# Patient Record
Sex: Male | Born: 1960 | Race: White | Hispanic: No | Marital: Single | Smoking: Current every day smoker
Health system: Southern US, Community
[De-identification: ages and names within clinical notes are randomized; demographics above are authoritative.]

## PROBLEM LIST (undated history)

## (undated) DIAGNOSIS — K219 Gastro-esophageal reflux disease without esophagitis: Secondary | ICD-10-CM

## (undated) DIAGNOSIS — N189 Chronic kidney disease, unspecified: Secondary | ICD-10-CM

## (undated) DIAGNOSIS — F419 Anxiety disorder, unspecified: Secondary | ICD-10-CM

## (undated) DIAGNOSIS — IMO0001 Reserved for inherently not codable concepts without codable children: Secondary | ICD-10-CM

## (undated) HISTORY — PX: HERNIA REPAIR: SHX51

---

## 2009-06-06 ENCOUNTER — Emergency Department (HOSPITAL_COMMUNITY): Admission: EM | Admit: 2009-06-06 | Discharge: 2009-06-07 | Payer: Self-pay | Admitting: Emergency Medicine

## 2010-04-03 LAB — DIFFERENTIAL
Basophils Relative: 0 % (ref 0–1)
Monocytes Absolute: 0.5 10*3/uL (ref 0.1–1.0)

## 2010-04-03 LAB — CBC
HCT: 43 % (ref 39.0–52.0)
MCHC: 34.8 g/dL (ref 30.0–36.0)
WBC: 8 10*3/uL (ref 4.0–10.5)

## 2010-04-03 LAB — POCT I-STAT, CHEM 8
BUN: 13 mg/dL (ref 6–23)
Calcium, Ion: 1.11 mmol/L — ABNORMAL LOW (ref 1.12–1.32)
Chloride: 111 meq/L (ref 96–112)
Creatinine, Ser: 1.4 mg/dL (ref 0.4–1.5)
Glucose, Bld: 97 mg/dL (ref 70–99)
HCT: 46 % (ref 39.0–52.0)
Hemoglobin: 15.6 g/dL (ref 13.0–17.0)
Potassium: 4 meq/L (ref 3.5–5.1)
Sodium: 143 meq/L (ref 135–145)
TCO2: 23 mmol/L (ref 0–100)
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
## 2013-08-25 ENCOUNTER — Encounter: Payer: Self-pay | Admitting: Gastroenterology

## 2013-10-27 ENCOUNTER — Ambulatory Visit: Payer: Self-pay | Admitting: Gastroenterology

## 2013-11-09 ENCOUNTER — Other Ambulatory Visit: Payer: Self-pay | Admitting: Urology

## 2013-11-26 ENCOUNTER — Encounter (HOSPITAL_COMMUNITY): Payer: Self-pay | Admitting: *Deleted

## 2013-11-30 ENCOUNTER — Ambulatory Visit (HOSPITAL_COMMUNITY): Payer: BC Managed Care – PPO

## 2013-11-30 ENCOUNTER — Ambulatory Visit (HOSPITAL_COMMUNITY)
Admission: RE | Admit: 2013-11-30 | Discharge: 2013-11-30 | Disposition: A | Discharge status: Home / Self Care | Payer: BC Managed Care – PPO | Source: Ambulatory Visit | Attending: Urology | Admitting: Urology

## 2013-11-30 ENCOUNTER — Encounter (HOSPITAL_COMMUNITY)
Admission: RE | Disposition: A | Discharge status: Home / Self Care | Payer: Self-pay | Source: Ambulatory Visit | Attending: Urology

## 2013-11-30 ENCOUNTER — Encounter (HOSPITAL_COMMUNITY): Payer: Self-pay | Admitting: *Deleted

## 2013-11-30 DIAGNOSIS — N202 Calculus of kidney with calculus of ureter: Secondary | ICD-10-CM | POA: Insufficient documentation

## 2013-11-30 DIAGNOSIS — F17210 Nicotine dependence, cigarettes, uncomplicated: Secondary | ICD-10-CM | POA: Diagnosis not present

## 2013-11-30 DIAGNOSIS — F419 Anxiety disorder, unspecified: Secondary | ICD-10-CM | POA: Insufficient documentation

## 2013-11-30 DIAGNOSIS — N200 Calculus of kidney: Secondary | ICD-10-CM

## 2013-11-30 DIAGNOSIS — K219 Gastro-esophageal reflux disease without esophagitis: Secondary | ICD-10-CM | POA: Diagnosis not present

## 2013-11-30 HISTORY — DX: Gastro-esophageal reflux disease without esophagitis: K21.9

## 2013-11-30 HISTORY — DX: Anxiety disorder, unspecified: F41.9

## 2013-11-30 HISTORY — DX: Reserved for inherently not codable concepts without codable children: IMO0001

## 2013-11-30 HISTORY — DX: Chronic kidney disease, unspecified: N18.9

## 2013-11-30 SURGERY — LITHOTRIPSY, ESWL
Anesthesia: LOCAL | Laterality: Right

## 2013-11-30 MED ORDER — DIPHENHYDRAMINE HCL 25 MG PO CAPS
25.0000 mg | ORAL_CAPSULE | ORAL | Status: AC
  Administered 2013-11-30: 25 mg via ORAL
  Filled 2013-11-30: qty 1

## 2013-11-30 MED ORDER — DEXTROSE-NACL 5-0.45 % IV SOLN
INTRAVENOUS | Status: DC
  Administered 2013-11-30: 09:00:00 via INTRAVENOUS

## 2013-11-30 MED ORDER — DIAZEPAM 5 MG PO TABS
10.0000 mg | ORAL_TABLET | ORAL | Status: AC
  Administered 2013-11-30: 10 mg via ORAL
  Filled 2013-11-30: qty 2

## 2013-11-30 MED ORDER — CIPROFLOXACIN HCL 500 MG PO TABS
500.0000 mg | ORAL_TABLET | ORAL | Status: AC
  Administered 2013-11-30: 500 mg via ORAL
  Filled 2013-11-30: qty 1

## 2013-11-30 NOTE — Progress Notes (Signed)
Dr. Patsi Searsannenbaum paged to give report tom family

## 2013-11-30 NOTE — H&P (Signed)
Reason For Visit F/u after CT   Active Problems Problems  1. Calculus of distal right ureter (N20.1)   Assessed By: Jethro Bolusannenbaum, Parley Pidcock (Urology); Last Assessed: 25 Sep 2013 2. Right nephrolithiasis (N20.0)   Assessed By: Jethro Bolusannenbaum, Niana Martorana (Urology); Last Assessed: 06 Nov 2013  History of Present Illness     53 yo single male returns today after CT scan. Originally referred by Dr. Manson PasseyBrown for further evaluation of RLQ pain & dysuria. 08/24/13 CT showed that he has a 3mm Rt UVJ stone. He thinks that he passed the stone, but is unsure. Took Flomax, but ran out of medicine. He has had the med refilled and is taking th e med now.  He is having urgency, frequency, dysuria, incontinence, intermittent, weak flow, hesitancy, and straining to void.    Family hx significant for father had prostate cancer.   Pt is voiding better on Flomax, but c/o " runny" ejaculate.   Past Medical History Problems  1. History of Anxiety (F41.9) 2. History of esophageal reflux (Z87.19)  Surgical History Problems  1. History of Inguinal Hernia Repair  Current Meds 1. Ampicillin 500 MG Oral Capsule; TAKE 1 CAPSULE 4 TIMES DAILY;  Therapy: 11Sep2015 to (Evaluate:16Sep2015)  Requested for:  11Sep2015; Last Rx:11Sep2015 Ordered 2. Omeprazole CPDR;  Therapy: (Recorded:11Sep2015) to Recorded 3. Tamsulosin HCl - 0.4 MG Oral Capsule;  Therapy: (Recorded:11Sep2015) to Recorded  Allergies Medication  1. No Known Drug Allergies  Family History Problems  1. Family history of Deceased : Father 2. Family history of prostate cancer (Z80.42) : Father  Social History Problems  1. Alcohol use (F10.99)   2 per day 2. Caffeine use (F15.90)   4-5 per day 3. Current every day smoker (F17.200) 4. Number of children   1 son, 1 daughter 5. Occupation   [employer], machine shop, Personnel officerlectrician 6. Single 7. Smokes cigarettes (F17.210)   1/2 ppd x 35 yrs  Review of Systems Genitourinary, constitutional,  skin, eye, otolaryngeal, hematologic/lymphatic, cardiovascular, pulmonary, endocrine, musculoskeletal, gastrointestinal, neurological and psychiatric system(s) were reviewed and pertinent findings if present are noted.  Genitourinary: urinary frequency, feelings of urinary urgency, dysuria, nocturia, incontinence, difficulty starting the urinary stream, weak urinary stream, urinary stream starts and stops, incomplete emptying of bladder, hematuria and initiating urination requires straining.  Gastrointestinal: nausea, vomiting, heartburn and diarrhea.  Constitutional: feeling tired (fatigue).  Respiratory: shortness of breath.  Psychiatric: anxiety.    Vitals Vital Signs [Data Includes: Last 1 Day]  Recorded: 23Oct2015 02:31PM  Blood Pressure: 123 / 80 Temperature: 98.3 F Heart Rate: 85  Physical Exam Constitutional: Well nourished and well developed . No acute distress.  ENT:. The ears and nose are normal in appearance.  Neck: The appearance of the neck is normal and no neck mass is present.  Pulmonary: No respiratory distress and normal respiratory rhythm and effort.  Cardiovascular: Heart rate and rhythm are normal . No peripheral edema.  Abdomen: The abdomen is soft and nontender. No masses are palpated. No CVA tenderness. No hernias are palpable. No hepatosplenomegaly noted.  Genitourinary: Examination of the penis demonstrates no discharge, no masses, no lesions and a normal meatus. The scrotum is without lesions. The right epididymis is palpably normal and non-tender. The left epididymis is palpably normal and non-tender. The right testis is non-tender and without masses. The left testis is non-tender and without masses.  Lymphatics: The femoral and inguinal nodes are not enlarged or tender.  Skin: Normal skin turgor, no  visible rash and no visible skin lesions.  Neuro/Psych:. Mood and affect are appropriate.    Results/Data Selected Results  AU CT-STONE PROTOCOL 02Oct2015 12:00AM  Jethro Bolusannenbaum, Sita Mangen   Test Name Result Flag Reference  CT-STONE PROTOCOL (Report)    ** RADIOLOGY REPORT BY [facility] **   CLINICAL DATA: 53 year old male with history of right-sided back pain. Known history of right distal ureteral stone.  EXAM: CT ABDOMEN AND PELVIS WITHOUT CONTRAST (URINARY CALCULUS PROTOCOL)  TECHNIQUE: Multidetector CT imaging was performed through the abdomen and pelvis without intravenous contrast to include the urinary tract.  COMPARISON: CT of the abdomen and pelvis 08/24/2013.  FINDINGS: Lower chest: Unremarkable.  Hepatobiliary: The low attenuation throughout the hepatic parenchyma noted on the prior study has resolved on today's examination. There continue to be several small low-attenuation lesions in the liver, largest of which is in the central aspect of segment 4A measuring 13 x 14 mm (image 11 of series 2). Gallbladder is unremarkable in appearance.  Pancreas: Unremarkable.  Spleen: Unremarkable.  Adrenals/Urinary Tract: The previously noted calculus at the right ureterovesicular junction is no longer evident, and has presumably passed. There continues to be a 7 mm nonobstructive calculus in the upper pole collecting system of the right kidney. 1 mm nonobstructive calculus in the interpolar collecting system of the left kidney is also unchanged. No ureteral stones or bladder calculi are noted at this time. No hydroureteronephrosis or perinephric stranding to indicate urinary tract obstruction at this time. The appearance of the adrenal glands is unremarkable.  Stomach/Bowel: Stomach is grossly normal in appearance. No pathologic dilatation of the small bowel or colon. No inflammatory changes associated with the small bowel or colon.  Vascular/Lymphatic: Minimal atherosclerotic calcifications in the abdominal vasculature, without evidence of aneurysm. No definite lymphadenopathy identified in the abdomen or pelvis on  today's non contrast CT examination.  Reproductive: Prostate gland is unremarkable in appearance.  Other: No significant volume of ascites. No pneumoperitoneum.  Musculoskeletal: There are no aggressive appearing lytic or blastic lesions noted in the visualized portions of the skeleton.  IMPRESSION: 1. Previously noted 3 mm calculus at the right ureterovesicular junction is no longer identified in the bladder, and has presumably passed. 2. Nonobstructive calculi in the collecting systems of the kidneys bilaterally are again noted, largest of which measures 7 mm in the upper pole of the right kidney. 3. Previously noted hepatic steatosis has resolved. However, there are several small low-attenuation liver lesions which are incompletely characterized on today's non contrast CT examination. These could be definitively characterized with MRI of the abdomen with and without IV gadolinium if of clinical concern, however, these are statistically likely small cysts. 4. Additional incidental findings, as above.   Electronically Signed  By: Trudie Reedaniel Entrikin M.D.  On: 10/16/2013 09:58   Assessment Assessed  1. Right nephrolithiasis (N20.0)  1. Pt has 6.166mm R upper pole stone. Will need lithotripsy. Appears to have 1mm Ca++ in L kidney, but this is not of consequence. He has passed the stone from the Right lower ureter.    Flomax is causing some retrograde ejaculation.   Plan Right nephrolithiasis  1. Changed: From  Tamsulosin HCl - 0.4 MG Oral Capsule  To Tamsulosin  HCl - 0.4 MG Oral Capsule TAKE 1 CAPSULE Bedtime 2. Follow-up Schedule Surgery Office  Follow-up  Status: Hold For -  Appointment  Requested for: 23Oct2015  1. Continue Flomax  2. Schedulel lithotripsy of 6.16mm R upper pole stone.   Discussion/Summary cc: Dr.  [employer] @ [facility]     Signatures Electronically signed by : Jethro Bolus, M.D.; Nov 06 2013  2:54PM EST

## 2013-11-30 NOTE — Interval H&P Note (Signed)
History and Physical Interval Note:  11/30/2013 10:30 AM  Johnathan Vargas  has presented today for surgery, with the diagnosis of RIGHT URETERAL STONE  The various methods of treatment have been discussed with the patient and family. After consideration of risks, benefits and other options for treatment, the patient has consented to  Procedure(s): RIGHT EXTRACORPOREAL SHOCK WAVE LITHOTRIPSY (ESWL) (Right) as a surgical intervention .  The patient's history has been reviewed, patient examined, no change in status, stable for surgery.  I have reviewed the patient's chart and labs.  Questions were answered to the patient's satisfaction.     Jethro BolusANNENBAUM, Ahmari Duerson I

## 2013-11-30 NOTE — Discharge Instructions (Signed)
Kidney Stones °Kidney stones (urolithiasis) are solid masses that form inside your kidneys. The intense pain is caused by the stone moving through the kidney, ureter, bladder, and urethra (urinary tract). When the stone moves, the ureter starts to spasm around the stone. The stone is usually passed in your pee (urine).  °HOME CARE °· Drink enough fluids to keep your pee clear or pale yellow. This helps to get the stone out. °· Strain all pee through the provided strainer. Do not pee without peeing through the strainer, not even once. If you pee the stone out, catch it in the strainer. The stone may be as small as a grain of salt. Take this to your doctor. This will help your doctor figure out what you can do to try to prevent more kidney stones. °· Only take medicine as told by your doctor. °· Follow up with your doctor as told. °· Get follow-up X-rays as told by your doctor. °GET HELP IF: °You have pain that gets worse even if you have been taking pain medicine. °GET HELP RIGHT AWAY IF:  °· Your pain does not get better with medicine. °· You have a fever or shaking chills. °· Your pain increases and gets worse over 18 hours. °· You have new belly (abdominal) pain. °· You feel faint or pass out. °· You are unable to pee. °MAKE SURE YOU:  °· Understand these instructions. °· Will watch your condition. °· Will get help right away if you are not doing well or get worse. °Document Released: 06/20/2007 Document Revised: 09/03/2012 Document Reviewed: 06/04/2012 °ExitCare® Patient Information ©2015 [employer]. This information is not intended to replace advice given to you by your health care provider. Make sure you discuss any questions you have with your health care provider. ° °Dietary Guidelines to Help Prevent Kidney Stones °Your risk of kidney stones can be decreased by adjusting the foods you eat. The most important thing you can do is drink enough fluid. You should drink enough fluid to keep your urine clear or  pale yellow. The following guidelines provide specific information for the type of kidney stone you have had. °GUIDELINES ACCORDING TO TYPE OF KIDNEY STONE °Calcium Oxalate Kidney Stones °· Reduce the amount of salt you eat. Foods that have a lot of salt cause your body to release excess calcium into your urine. The excess calcium can combine with a substance called oxalate to form kidney stones. °· Reduce the amount of animal protein you eat if the amount you eat is excessive. Animal protein causes your body to release excess calcium into your urine. Ask your dietitian how much protein from animal sources you should be eating. °· Avoid foods that are high in oxalates. If you take vitamins, they should have less than 500 mg of vitamin C. Your body turns vitamin C into oxalates. You do not need to avoid fruits and vegetables high in vitamin C. °Calcium Phosphate Kidney Stones °· Reduce the amount of salt you eat to help prevent the release of excess calcium into your urine. °· Reduce the amount of animal protein you eat if the amount you eat is excessive. Animal protein causes your body to release excess calcium into your urine. Ask your dietitian how much protein from animal sources you should be eating. °· Get enough calcium from food or take a calcium supplement (ask your dietitian for recommendations). Food sources of calcium that do not increase your risk of kidney stones include: °¨ Broccoli. °¨ Dairy products,   such as cheese and yogurt. °¨ Pudding. °Uric Acid Kidney Stones °· Do not have more than 6 oz of animal protein per day. °FOOD SOURCES °Animal Protein Sources °· Meat (all types). °· Poultry. °· Eggs. °· Fish, seafood. °Foods High in Salt °· Salt seasonings. °· Soy sauce. °· Teriyaki sauce. °· Cured and processed meats. °· Salted crackers and snack foods. °· Fast food. °· Canned soups and most canned foods. °Foods High in Oxalates °· Grains: °¨ Amaranth. °¨ Barley. °¨ Grits. °¨ Wheat  germ. °¨ Bran. °¨ Buckwheat flour. °¨ All bran cereals. °¨ Pretzels. °¨ Whole wheat bread. °· Vegetables: °¨ Beans (wax). °¨ Beets and beet greens. °¨ Collard greens. °¨ Eggplant. °¨ Escarole. °¨ Leeks. °¨ Okra. °¨ Parsley. °¨ Rutabagas. °¨ Spinach. °¨ Swiss chard. °¨ Tomato paste. °¨ Fried potatoes. °¨ Sweet potatoes. °· Fruits: °¨ Red currants. °¨ Figs. °¨ Kiwi. °¨ Rhubarb. °· Meat and Other Protein Sources: °¨ Beans (dried). °¨ Soy burgers and other soybean products. °¨ Miso. °¨ Nuts (peanuts, almonds, pecans, cashews, hazelnuts). °¨ Nut butters. °¨ Sesame seeds and tahini (paste made of sesame seeds). °¨ Poppy seeds. °· Beverages: °¨ Chocolate drink mixes. °¨ Soy milk. °¨ Instant iced tea. °¨ Juices made from high-oxalate fruits or vegetables. °· Other: °¨ Carob. °¨ Chocolate. °¨ Fruitcake. °¨ Marmalades. °Document Released: 04/28/2010 Document Revised: 01/06/2013 Document Reviewed: 11/28/2012 °ExitCare® Patient Information ©2015 [employer]. This information is not intended to replace advice given to you by your health care provider. Make sure you discuss any questions you have with your health care provider. ° °Lithotripsy for Kidney Stones °Lithotripsy is a treatment that can sometimes help eliminate kidney stones and pain that they cause. A form of lithotripsy, also known as extracorporeal shock wave lithotripsy, is a nonsurgical procedure that helps your body rid itself of the kidney stone when it is too big to pass on its own. Extracorporeal shock wave lithotripsy is a method of crushing a kidney stone with shock waves. These shock waves pass through your body and are focused on your stone. They cause the kidney stones to crumble while still in the urinary tract. It is then easier for the smaller pieces of stone to pass in the urine. °Lithotripsy usually takes about an hour. It is done in a hospital, a lithotripsy center, or a mobile unit. It usually does not require an overnight stay. Your health care  provider will instruct you on preparation for the procedure. Your health care provider will tell you what to expect afterward. °LET YOUR HEALTH CARE PROVIDER KNOW ABOUT: °· Any allergies you have. °· All medicines you are taking, including vitamins, herbs, eye drops, creams, and over-the-counter medicines. °· Previous problems you or members of your family have had with the use of anesthetics. °· Any blood disorders you have. °· Previous surgeries you have had. °· Medical conditions you have. °RISKS AND COMPLICATIONS °Generally, lithotripsy for kidney stones is a safe procedure. However, as with any procedure, complications can occur. Possible complications include: °· Infection. °· Bleeding of the kidney. °· Bruising of the kidney or skin. °· Obstruction of the ureter. °· Failure of the stone to fragment. °BEFORE THE PROCEDURE °· Do not eat or drink for 6-8 hours prior to the procedure. You may, however, take the medications with a sip of water that your physician instructs you to take °· Do not take aspirin or aspirin-containing products for 7 days prior to your procedure °· Do not take nonsteroidal anti-inflammatory products for   7 days prior to your procedure °PROCEDURE °A stent (flexible tube with holes) may be placed in your ureter. The ureter is the tube that transports the urine from the kidneys to the bladder. Your health care provider may place a stent before the procedure. This will help keep urine flowing from the kidney if the fragments of the stone block the ureter. You may have an IV tube placed in one of your veins to give you fluids and medicines. These medicines may help you relax or make you sleep. During the procedure, you will lie comfortably on a fluid-filled cushion or in a warm-water bath. After an X-ray or ultrasound exam to locate your stone, shock waves are aimed at the stone. If you are awake, you may feel a tapping sensation as the shock waves pass through your body. If large stone  particles remain after treatment, a second procedure may be necessary at a later date. °For comfort during the test: °· Relax as much as possible. °· Try to remain still as much as possible. °· Try to follow instructions to speed up the test. °· Let your health care provider know if you are uncomfortable, anxious, or in pain. °AFTER THE PROCEDURE  °After surgery, you will be taken to the recovery area. A nurse will watch and check your progress. Once you're awake, stable, and taking fluids well, you will be allowed to go home as long as there are no problems. You will also be allowed to pass your urine before discharge. You may be given antibiotics to help prevent infection. You may also be prescribed pain medicine if needed. In a week or two, your health care provider may remove your stent, if you have one. You may first have an X-ray exam to check on how successful the fragmentation of your stone has been and how much of the stone has passed. Your health care provider will check to see whether or not stone particles remain. °SEEK IMMEDIATE MEDICAL CARE IF: °· You develop a fever or shaking chills. °· Your pain is not relieved by medicine. °· You feel sick to your stomach (nauseated) and you vomit. °· You develop heavy bleeding. °· You have difficulty urinating. °· You start to pass your stent from your penis. °Document Released: 12/30/1999 Document Revised: 10/22/2012 Document Reviewed: 07/17/2012 °ExitCare® Patient Information ©2015 [employer]. This information is not intended to replace advice given to you by your health care provider. Make sure you discuss any questions you have with your health care provider. ° °

## 2013-12-23 ENCOUNTER — Ambulatory Visit: Payer: Self-pay | Admitting: Gastroenterology

## 2016-02-27 DIAGNOSIS — R5383 Other fatigue: Secondary | ICD-10-CM | POA: Diagnosis not present

## 2016-02-27 DIAGNOSIS — R6882 Decreased libido: Secondary | ICD-10-CM | POA: Diagnosis not present

## 2016-02-27 DIAGNOSIS — R072 Precordial pain: Secondary | ICD-10-CM | POA: Diagnosis not present

## 2016-02-27 DIAGNOSIS — K210 Gastro-esophageal reflux disease with esophagitis: Secondary | ICD-10-CM | POA: Diagnosis not present

## 2016-06-29 DIAGNOSIS — K210 Gastro-esophageal reflux disease with esophagitis: Secondary | ICD-10-CM | POA: Diagnosis not present

## 2016-06-29 DIAGNOSIS — R030 Elevated blood-pressure reading, without diagnosis of hypertension: Secondary | ICD-10-CM | POA: Diagnosis not present

## 2016-10-25 DIAGNOSIS — M791 Myalgia, unspecified site: Secondary | ICD-10-CM | POA: Diagnosis not present

## 2016-10-25 DIAGNOSIS — F411 Generalized anxiety disorder: Secondary | ICD-10-CM | POA: Diagnosis not present

## 2016-10-25 DIAGNOSIS — M25511 Pain in right shoulder: Secondary | ICD-10-CM | POA: Diagnosis not present

## 2016-10-25 DIAGNOSIS — M545 Low back pain: Secondary | ICD-10-CM | POA: Diagnosis not present

## 2016-11-11 DIAGNOSIS — L989 Disorder of the skin and subcutaneous tissue, unspecified: Secondary | ICD-10-CM | POA: Diagnosis not present

## 2016-11-11 DIAGNOSIS — T63301A Toxic effect of unspecified spider venom, accidental (unintentional), initial encounter: Secondary | ICD-10-CM | POA: Diagnosis not present

## 2016-12-15 DIAGNOSIS — Z23 Encounter for immunization: Secondary | ICD-10-CM | POA: Diagnosis not present

## 2016-12-28 DIAGNOSIS — Z131 Encounter for screening for diabetes mellitus: Secondary | ICD-10-CM | POA: Diagnosis not present

## 2016-12-28 DIAGNOSIS — Z125 Encounter for screening for malignant neoplasm of prostate: Secondary | ICD-10-CM | POA: Diagnosis not present

## 2016-12-28 DIAGNOSIS — Z0000 Encounter for general adult medical examination without abnormal findings: Secondary | ICD-10-CM | POA: Diagnosis not present

## 2017-01-04 DIAGNOSIS — R1310 Dysphagia, unspecified: Secondary | ICD-10-CM | POA: Diagnosis not present

## 2017-01-04 DIAGNOSIS — K210 Gastro-esophageal reflux disease with esophagitis: Secondary | ICD-10-CM | POA: Diagnosis not present

## 2017-01-04 DIAGNOSIS — Z0000 Encounter for general adult medical examination without abnormal findings: Secondary | ICD-10-CM | POA: Diagnosis not present

## 2017-01-04 DIAGNOSIS — R002 Palpitations: Secondary | ICD-10-CM | POA: Diagnosis not present

## 2017-01-23 ENCOUNTER — Other Ambulatory Visit: Payer: Self-pay | Admitting: Gastroenterology

## 2017-01-23 DIAGNOSIS — R1013 Epigastric pain: Secondary | ICD-10-CM

## 2017-01-23 DIAGNOSIS — R1310 Dysphagia, unspecified: Secondary | ICD-10-CM | POA: Diagnosis not present

## 2017-01-23 DIAGNOSIS — Z1211 Encounter for screening for malignant neoplasm of colon: Secondary | ICD-10-CM | POA: Diagnosis not present

## 2017-02-01 ENCOUNTER — Ambulatory Visit
Admission: RE | Admit: 2017-02-01 | Discharge: 2017-02-01 | Disposition: A | Discharge status: Home / Self Care | Payer: BLUE CROSS/BLUE SHIELD | Source: Ambulatory Visit | Attending: Gastroenterology | Admitting: Gastroenterology

## 2017-02-01 DIAGNOSIS — R1013 Epigastric pain: Secondary | ICD-10-CM

## 2017-02-01 DIAGNOSIS — K7689 Other specified diseases of liver: Secondary | ICD-10-CM | POA: Diagnosis not present

## 2017-02-22 DIAGNOSIS — K635 Polyp of colon: Secondary | ICD-10-CM | POA: Diagnosis not present

## 2017-02-22 DIAGNOSIS — R1013 Epigastric pain: Secondary | ICD-10-CM | POA: Diagnosis not present

## 2017-02-22 DIAGNOSIS — K228 Other specified diseases of esophagus: Secondary | ICD-10-CM | POA: Diagnosis not present

## 2017-02-22 DIAGNOSIS — Z1211 Encounter for screening for malignant neoplasm of colon: Secondary | ICD-10-CM | POA: Diagnosis not present

## 2017-02-22 DIAGNOSIS — D122 Benign neoplasm of ascending colon: Secondary | ICD-10-CM | POA: Diagnosis not present

## 2017-02-22 DIAGNOSIS — K209 Esophagitis, unspecified: Secondary | ICD-10-CM | POA: Diagnosis not present

## 2017-02-22 DIAGNOSIS — D127 Benign neoplasm of rectosigmoid junction: Secondary | ICD-10-CM | POA: Diagnosis not present

## 2017-02-22 DIAGNOSIS — R1310 Dysphagia, unspecified: Secondary | ICD-10-CM | POA: Diagnosis not present

## 2017-07-05 DIAGNOSIS — E782 Mixed hyperlipidemia: Secondary | ICD-10-CM | POA: Diagnosis not present

## 2017-07-12 DIAGNOSIS — E782 Mixed hyperlipidemia: Secondary | ICD-10-CM | POA: Diagnosis not present

## 2017-07-12 DIAGNOSIS — R030 Elevated blood-pressure reading, without diagnosis of hypertension: Secondary | ICD-10-CM | POA: Diagnosis not present

## 2017-07-12 DIAGNOSIS — K210 Gastro-esophageal reflux disease with esophagitis: Secondary | ICD-10-CM | POA: Diagnosis not present

## 2017-08-06 DIAGNOSIS — H9319 Tinnitus, unspecified ear: Secondary | ICD-10-CM | POA: Diagnosis not present

## 2017-08-06 DIAGNOSIS — E782 Mixed hyperlipidemia: Secondary | ICD-10-CM | POA: Diagnosis not present

## 2017-08-22 DIAGNOSIS — N390 Urinary tract infection, site not specified: Secondary | ICD-10-CM | POA: Diagnosis not present

## 2017-08-22 DIAGNOSIS — R1031 Right lower quadrant pain: Secondary | ICD-10-CM | POA: Diagnosis not present

## 2017-08-23 DIAGNOSIS — N200 Calculus of kidney: Secondary | ICD-10-CM | POA: Diagnosis not present

## 2017-08-23 DIAGNOSIS — K5730 Diverticulosis of large intestine without perforation or abscess without bleeding: Secondary | ICD-10-CM | POA: Diagnosis not present

## 2017-08-23 DIAGNOSIS — K7689 Other specified diseases of liver: Secondary | ICD-10-CM | POA: Diagnosis not present

## 2017-09-27 DIAGNOSIS — E782 Mixed hyperlipidemia: Secondary | ICD-10-CM | POA: Diagnosis not present

## 2017-09-27 DIAGNOSIS — R030 Elevated blood-pressure reading, without diagnosis of hypertension: Secondary | ICD-10-CM | POA: Diagnosis not present

## 2017-10-04 DIAGNOSIS — R030 Elevated blood-pressure reading, without diagnosis of hypertension: Secondary | ICD-10-CM | POA: Diagnosis not present

## 2017-10-04 DIAGNOSIS — E782 Mixed hyperlipidemia: Secondary | ICD-10-CM | POA: Diagnosis not present

## 2017-10-04 DIAGNOSIS — F17200 Nicotine dependence, unspecified, uncomplicated: Secondary | ICD-10-CM | POA: Diagnosis not present

## 2017-10-28 DIAGNOSIS — R072 Precordial pain: Secondary | ICD-10-CM | POA: Diagnosis not present

## 2017-10-28 DIAGNOSIS — K210 Gastro-esophageal reflux disease with esophagitis: Secondary | ICD-10-CM | POA: Diagnosis not present

## 2018-01-17 DIAGNOSIS — Z125 Encounter for screening for malignant neoplasm of prostate: Secondary | ICD-10-CM | POA: Diagnosis not present

## 2018-01-17 DIAGNOSIS — E782 Mixed hyperlipidemia: Secondary | ICD-10-CM | POA: Diagnosis not present

## 2018-01-24 DIAGNOSIS — E782 Mixed hyperlipidemia: Secondary | ICD-10-CM | POA: Diagnosis not present

## 2018-01-24 DIAGNOSIS — Z0000 Encounter for general adult medical examination without abnormal findings: Secondary | ICD-10-CM | POA: Diagnosis not present

## 2018-01-24 DIAGNOSIS — R030 Elevated blood-pressure reading, without diagnosis of hypertension: Secondary | ICD-10-CM | POA: Diagnosis not present

## 2018-01-24 DIAGNOSIS — K210 Gastro-esophageal reflux disease with esophagitis: Secondary | ICD-10-CM | POA: Diagnosis not present

## 2018-02-21 IMAGING — US US ABDOMEN COMPLETE
1 series · 13 of 25 positions shown · non-contrast
Comparison: CT scan of October 16, 2013.

CLINICAL DATA: Epigastric abdominal pain.

EXAM:
ABDOMEN ULTRASOUND COMPLETE

[Series 1: us abdomen complete · 0.23mm/px · 13 of 86 slices shown]
[im 1/86]
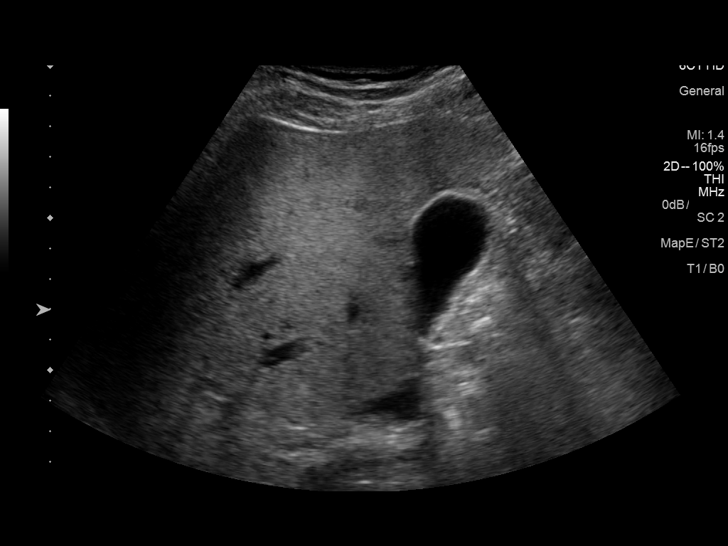
[im 8/86]
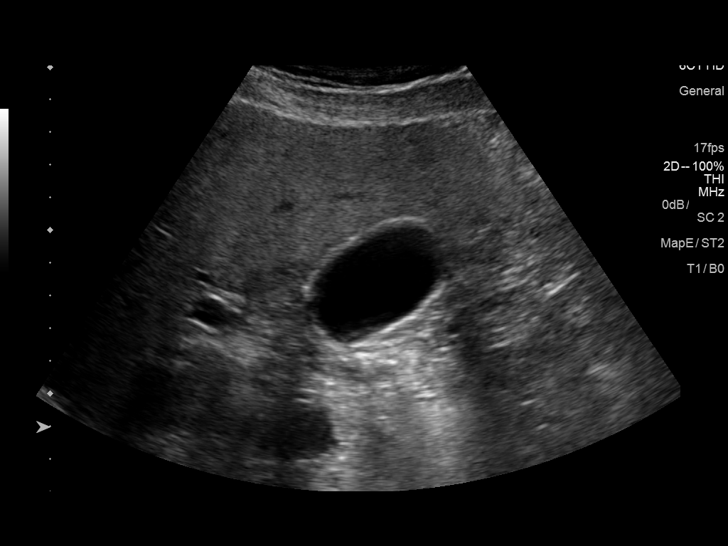
[im 15/86]
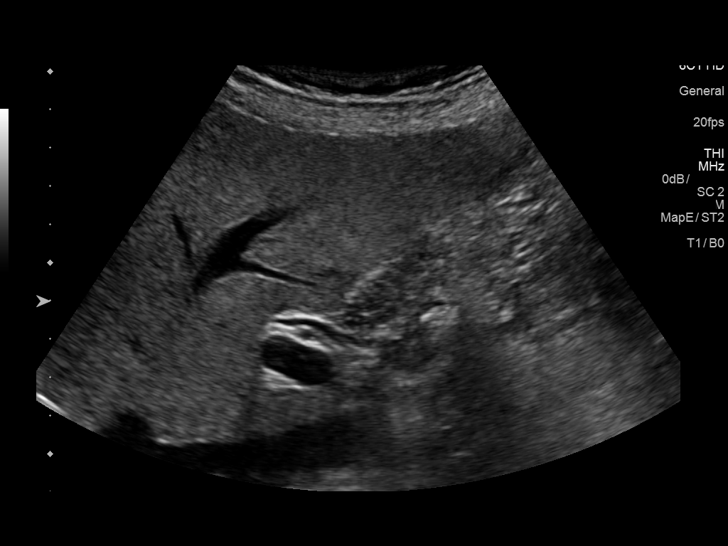
[im 22/86]
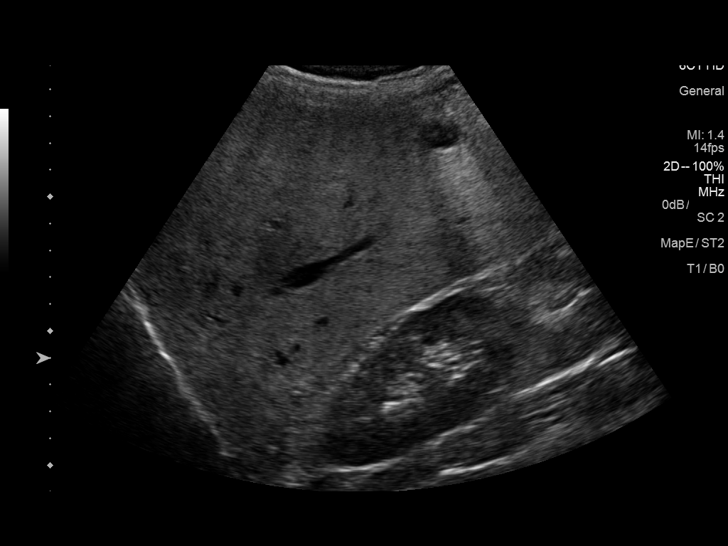
[im 29/86]
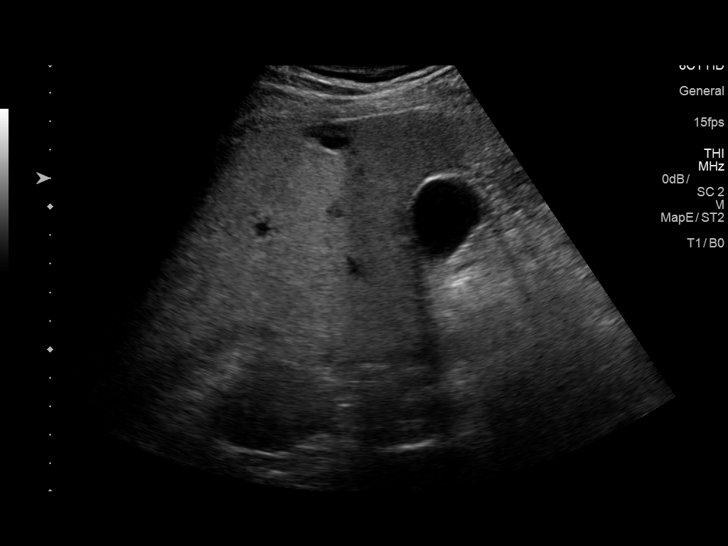
[im 36/86]
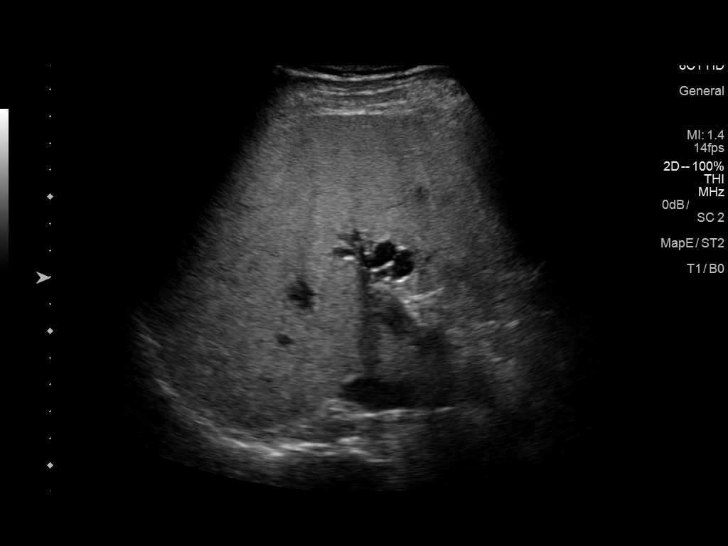
[im 43/86]
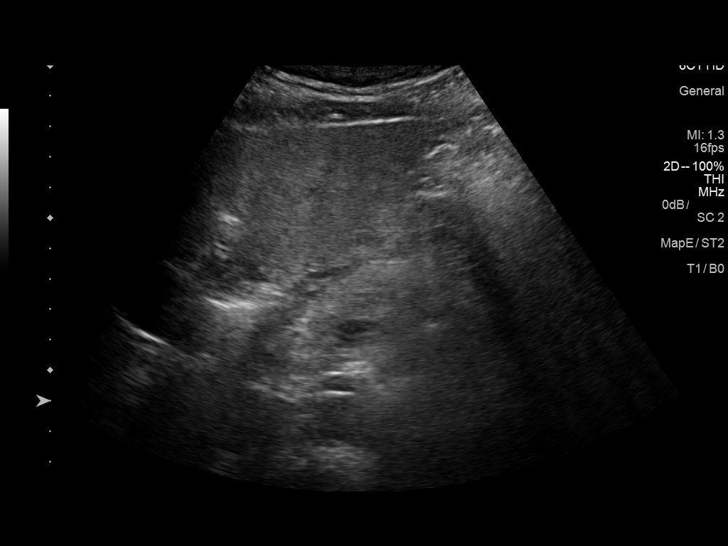
[im 50/86]
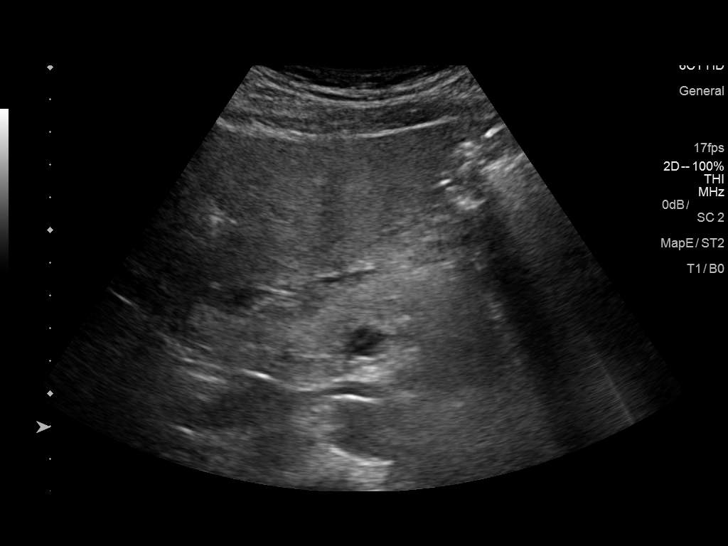
[im 57/86]
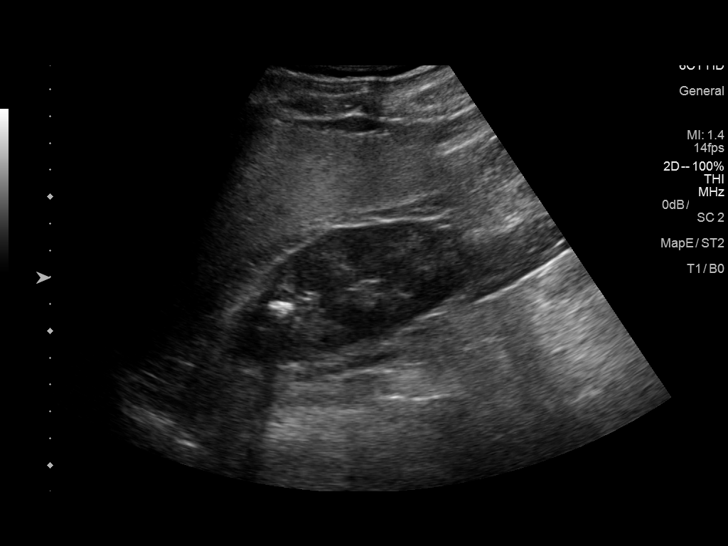
[im 64/86]
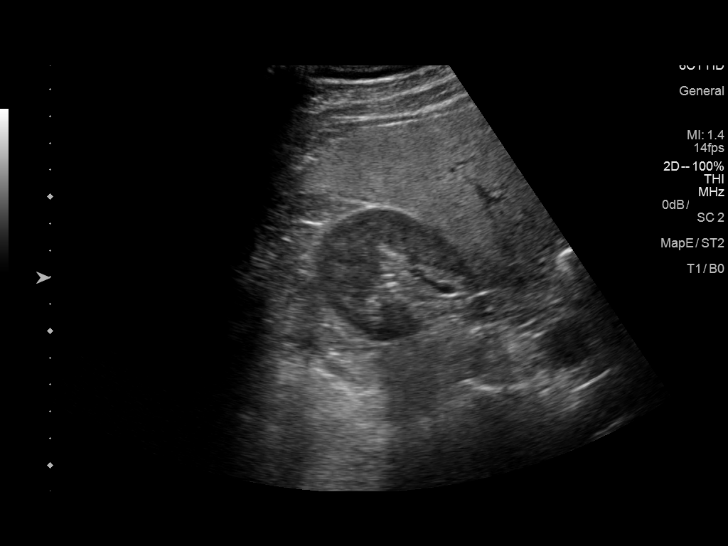
[im 71/86]
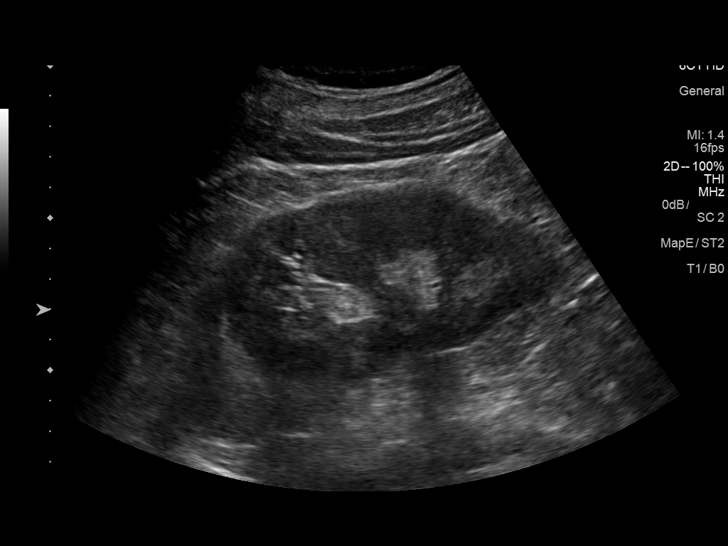
[im 78/86]
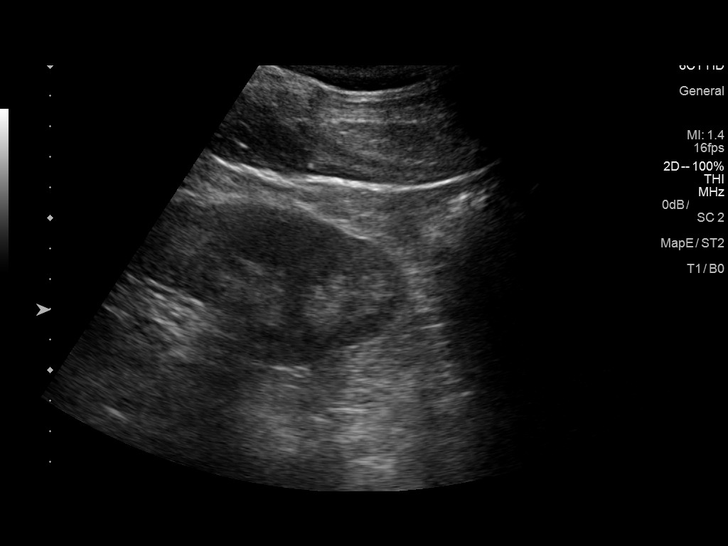
[im 86/86]
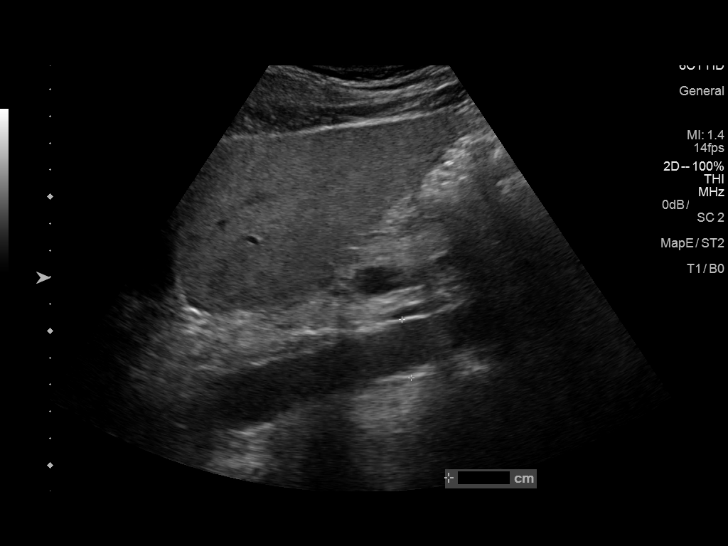

[13 of 25 positions shown; findings below may reference images not displayed]

FINDINGS: Gallbladder: No gallstones or wall thickening visualized. No
sonographic Murphy sign noted by sonographer.

Common bile duct: Diameter: 4 mm which is within normal limits.

Liver: Increased echogenicity of hepatic parenchyma is noted
suggesting fatty infiltration. 1.6 cm cyst is seen peripherally in
right hepatic lobe which is unchanged compared to prior CT exam. 2
cm complex cyst is seen adjacent to gallbladder fossa which was
present on prior CT exam.. Portal vein is patent on color Doppler
imaging with normal direction of blood flow towards the liver.

IVC: No abnormality visualized.

Pancreas: Visualized portion unremarkable.

Spleen: Size and appearance within normal limits.

Right Kidney: Length: 10.6 cm. 1 cm nonobstructive calculus is seen
in upper pole. Echogenicity within normal limits. No mass or
hydronephrosis visualized.

Left Kidney: Length: 11.8 cm. Echogenicity within normal limits. No
mass or hydronephrosis visualized.

Abdominal aorta: No aneurysm visualized.

Other findings: None.
IMPRESSION: Increased echogenicity of hepatic parenchyma is noted suggesting
fatty infiltration.

Two hepatic cysts are noted which do not appear to be significantly
changed compared to prior exam of 4637.

Nonobstructive right renal calculus is noted. No hydronephrosis or
renal obstruction is noted.

## 2019-02-20 DIAGNOSIS — Z125 Encounter for screening for malignant neoplasm of prostate: Secondary | ICD-10-CM | POA: Diagnosis not present

## 2019-02-20 DIAGNOSIS — Z0000 Encounter for general adult medical examination without abnormal findings: Secondary | ICD-10-CM | POA: Diagnosis not present

## 2019-02-20 DIAGNOSIS — E7800 Pure hypercholesterolemia, unspecified: Secondary | ICD-10-CM | POA: Diagnosis not present

## 2019-02-27 DIAGNOSIS — Z0000 Encounter for general adult medical examination without abnormal findings: Secondary | ICD-10-CM | POA: Diagnosis not present

## 2019-02-27 DIAGNOSIS — E782 Mixed hyperlipidemia: Secondary | ICD-10-CM | POA: Diagnosis not present

## 2020-01-06 ENCOUNTER — Other Ambulatory Visit: Payer: Self-pay

## 2020-01-06 DIAGNOSIS — Z20822 Contact with and (suspected) exposure to covid-19: Secondary | ICD-10-CM

## 2020-01-07 LAB — SARS-COV-2, NAA 2 DAY TAT

## 2020-01-07 LAB — NOVEL CORONAVIRUS, NAA: SARS-CoV-2, NAA: NOT DETECTED

## 2020-01-12 DIAGNOSIS — R0600 Dyspnea, unspecified: Secondary | ICD-10-CM | POA: Diagnosis not present

## 2020-01-12 DIAGNOSIS — R630 Anorexia: Secondary | ICD-10-CM | POA: Diagnosis not present

## 2020-01-12 DIAGNOSIS — R9389 Abnormal findings on diagnostic imaging of other specified body structures: Secondary | ICD-10-CM | POA: Diagnosis not present

## 2020-01-12 DIAGNOSIS — R0602 Shortness of breath: Secondary | ICD-10-CM | POA: Diagnosis not present

## 2020-01-12 DIAGNOSIS — R5383 Other fatigue: Secondary | ICD-10-CM | POA: Diagnosis not present

## 2020-07-22 DIAGNOSIS — Z23 Encounter for immunization: Secondary | ICD-10-CM | POA: Diagnosis not present

## 2021-10-18 ENCOUNTER — Other Ambulatory Visit: Payer: Self-pay | Admitting: Nurse Practitioner

## 2021-10-18 ENCOUNTER — Ambulatory Visit
Admission: RE | Admit: 2021-10-18 | Discharge: 2021-10-18 | Disposition: A | Discharge status: Home / Self Care | Payer: No Typology Code available for payment source | Source: Ambulatory Visit | Attending: Nurse Practitioner | Admitting: Nurse Practitioner

## 2021-10-18 DIAGNOSIS — S39012A Strain of muscle, fascia and tendon of lower back, initial encounter: Secondary | ICD-10-CM

## 2022-10-11 DIAGNOSIS — R351 Nocturia: Secondary | ICD-10-CM | POA: Diagnosis not present

## 2022-10-11 DIAGNOSIS — L989 Disorder of the skin and subcutaneous tissue, unspecified: Secondary | ICD-10-CM | POA: Diagnosis not present

## 2022-10-11 DIAGNOSIS — E782 Mixed hyperlipidemia: Secondary | ICD-10-CM | POA: Diagnosis not present

## 2022-10-11 DIAGNOSIS — N401 Enlarged prostate with lower urinary tract symptoms: Secondary | ICD-10-CM | POA: Diagnosis not present

## 2022-11-01 DIAGNOSIS — Z125 Encounter for screening for malignant neoplasm of prostate: Secondary | ICD-10-CM | POA: Diagnosis not present

## 2022-11-01 DIAGNOSIS — R7303 Prediabetes: Secondary | ICD-10-CM | POA: Diagnosis not present

## 2022-11-01 DIAGNOSIS — R030 Elevated blood-pressure reading, without diagnosis of hypertension: Secondary | ICD-10-CM | POA: Diagnosis not present

## 2022-11-01 DIAGNOSIS — E782 Mixed hyperlipidemia: Secondary | ICD-10-CM | POA: Diagnosis not present

## 2022-11-08 DIAGNOSIS — R7303 Prediabetes: Secondary | ICD-10-CM | POA: Diagnosis not present

## 2022-11-08 DIAGNOSIS — N401 Enlarged prostate with lower urinary tract symptoms: Secondary | ICD-10-CM | POA: Diagnosis not present

## 2022-11-08 DIAGNOSIS — Z0000 Encounter for general adult medical examination without abnormal findings: Secondary | ICD-10-CM | POA: Diagnosis not present

## 2022-11-08 DIAGNOSIS — R972 Elevated prostate specific antigen [PSA]: Secondary | ICD-10-CM | POA: Diagnosis not present

## 2022-11-08 DIAGNOSIS — E782 Mixed hyperlipidemia: Secondary | ICD-10-CM | POA: Diagnosis not present

## 2022-11-26 DIAGNOSIS — L821 Other seborrheic keratosis: Secondary | ICD-10-CM | POA: Diagnosis not present

## 2022-11-26 DIAGNOSIS — C44319 Basal cell carcinoma of skin of other parts of face: Secondary | ICD-10-CM | POA: Diagnosis not present

## 2022-12-26 DIAGNOSIS — N200 Calculus of kidney: Secondary | ICD-10-CM | POA: Diagnosis not present

## 2022-12-26 DIAGNOSIS — R3912 Poor urinary stream: Secondary | ICD-10-CM | POA: Diagnosis not present

## 2022-12-26 DIAGNOSIS — N50811 Right testicular pain: Secondary | ICD-10-CM | POA: Diagnosis not present

## 2022-12-26 DIAGNOSIS — N401 Enlarged prostate with lower urinary tract symptoms: Secondary | ICD-10-CM | POA: Diagnosis not present
